# Patient Record
Sex: Female | Born: 1970 | Race: White | Hispanic: Yes | Smoking: Never smoker
Health system: Southern US, Community
[De-identification: ages and names within clinical notes are randomized; demographics above are authoritative.]

## PROBLEM LIST (undated history)

## (undated) DIAGNOSIS — I10 Essential (primary) hypertension: Secondary | ICD-10-CM

## (undated) HISTORY — DX: Essential (primary) hypertension: I10

---

## 1999-07-16 ENCOUNTER — Emergency Department (HOSPITAL_COMMUNITY): Admission: EM | Admit: 1999-07-16 | Discharge: 1999-07-16 | Payer: Self-pay | Admitting: Emergency Medicine

## 2000-08-23 ENCOUNTER — Other Ambulatory Visit: Admission: RE | Admit: 2000-08-23 | Discharge: 2000-08-23 | Payer: Self-pay | Admitting: Gynecology

## 2000-08-31 ENCOUNTER — Ambulatory Visit (HOSPITAL_COMMUNITY): Admission: RE | Admit: 2000-08-31 | Discharge: 2000-08-31 | Payer: Self-pay | Admitting: Gynecology

## 2000-08-31 ENCOUNTER — Encounter: Payer: Self-pay | Admitting: Gynecology

## 2001-10-20 ENCOUNTER — Other Ambulatory Visit: Admission: RE | Admit: 2001-10-20 | Discharge: 2001-10-20 | Payer: Self-pay | Admitting: Gynecology

## 2002-10-23 ENCOUNTER — Other Ambulatory Visit: Admission: RE | Admit: 2002-10-23 | Discharge: 2002-10-23 | Payer: Self-pay | Admitting: Gynecology

## 2005-11-10 ENCOUNTER — Ambulatory Visit (HOSPITAL_COMMUNITY): Admission: RE | Admit: 2005-11-10 | Discharge: 2005-11-10 | Payer: Self-pay | Admitting: Family Medicine

## 2005-11-18 ENCOUNTER — Ambulatory Visit: Payer: Self-pay | Admitting: Gynecology

## 2005-11-25 ENCOUNTER — Ambulatory Visit: Payer: Self-pay | Admitting: Gynecology

## 2005-12-02 ENCOUNTER — Ambulatory Visit: Payer: Self-pay | Admitting: Family Medicine

## 2005-12-09 ENCOUNTER — Ambulatory Visit: Payer: Self-pay | Admitting: Family Medicine

## 2005-12-10 ENCOUNTER — Ambulatory Visit (HOSPITAL_COMMUNITY): Admission: RE | Admit: 2005-12-10 | Discharge: 2005-12-10 | Payer: Self-pay | Admitting: Obstetrics & Gynecology

## 2005-12-20 ENCOUNTER — Ambulatory Visit: Payer: Self-pay | Admitting: Gynecology

## 2005-12-30 ENCOUNTER — Ambulatory Visit: Payer: Self-pay | Admitting: *Deleted

## 2006-01-06 ENCOUNTER — Ambulatory Visit: Payer: Self-pay | Admitting: Family Medicine

## 2006-01-10 ENCOUNTER — Inpatient Hospital Stay (HOSPITAL_COMMUNITY): Admission: AD | Admit: 2006-01-10 | Discharge: 2006-01-16 | Payer: Self-pay | Admitting: Obstetrics & Gynecology

## 2006-01-10 ENCOUNTER — Ambulatory Visit: Payer: Self-pay | Admitting: Gynecology

## 2006-01-10 ENCOUNTER — Ambulatory Visit (HOSPITAL_COMMUNITY): Admission: RE | Admit: 2006-01-10 | Discharge: 2006-01-10 | Payer: Self-pay | Admitting: Obstetrics & Gynecology

## 2008-06-11 IMAGING — US US OB FOLLOW-UP EACH ADDL GEST (MODIFY)
1 series · 12 of 28 positions shown · non-contrast
Comparison: 12/10/05.

CLINICAL DATA: Twin gestation with assigned gestational age of 34 weeks 6 days.  Evaluate fetal growth.  

TWIN OBSTETRICAL ULTRASOUND RE-EVALUATION:

[Series 1: us ob follow-up each addl gest (modify) · 12 of 41 slices shown]
[im 2/41]
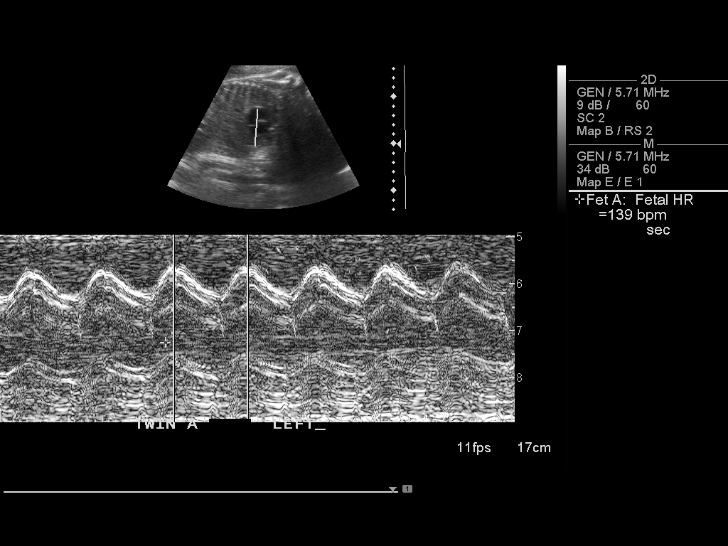
[im 5/41]
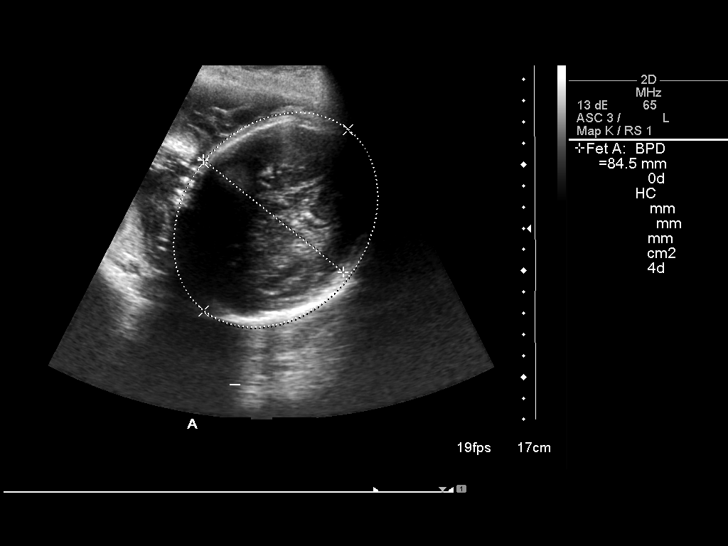
[im 8/41]
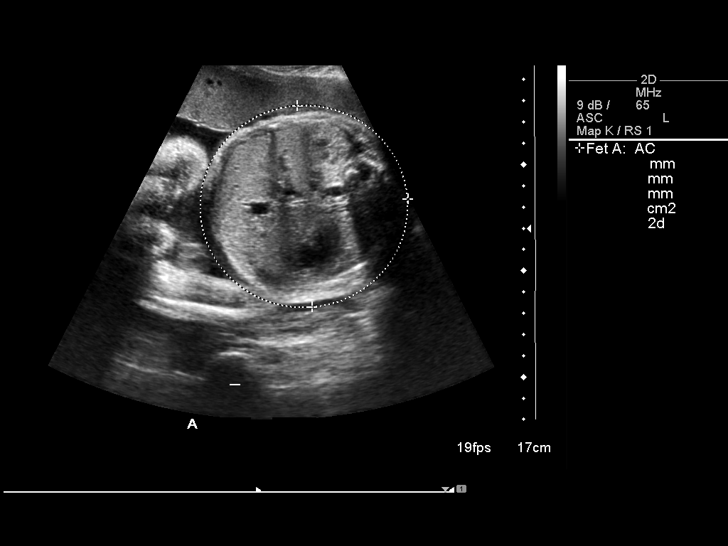
[im 12/41]
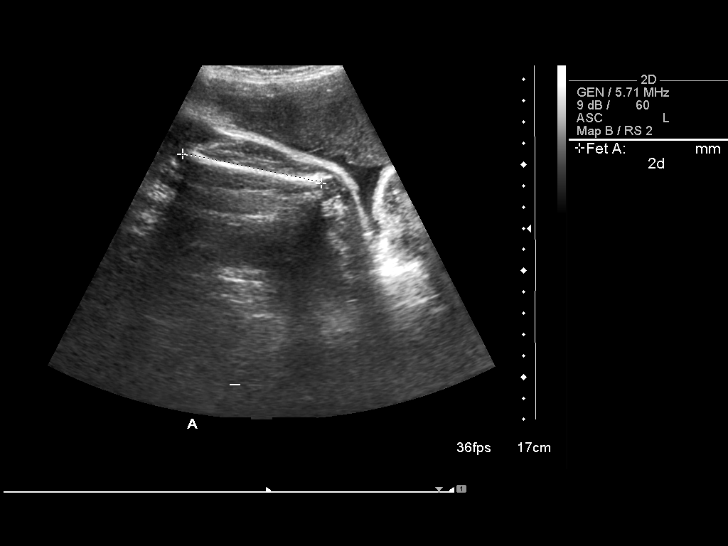
[im 15/41]
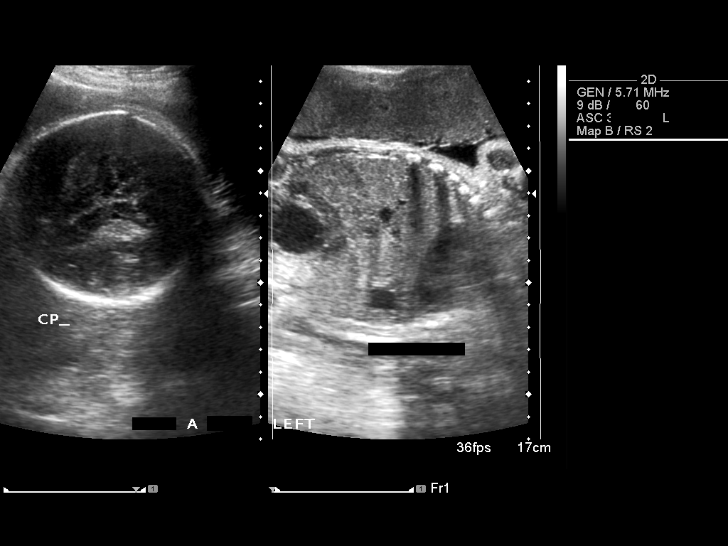
[im 18/41]
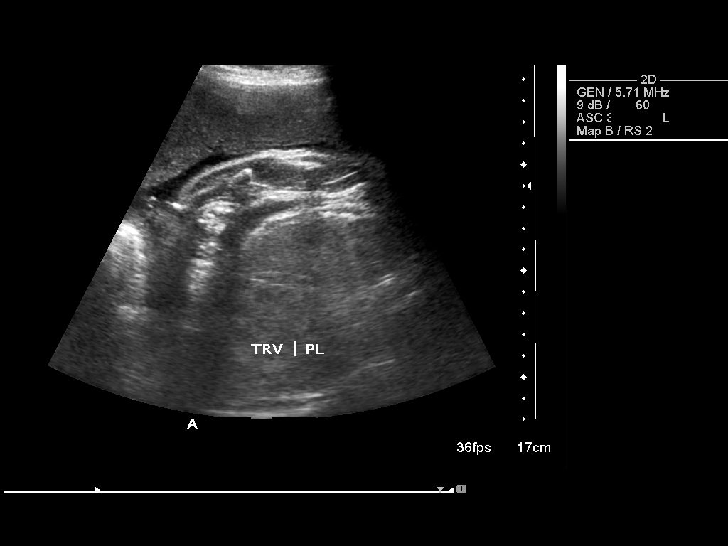
[im 23/41]
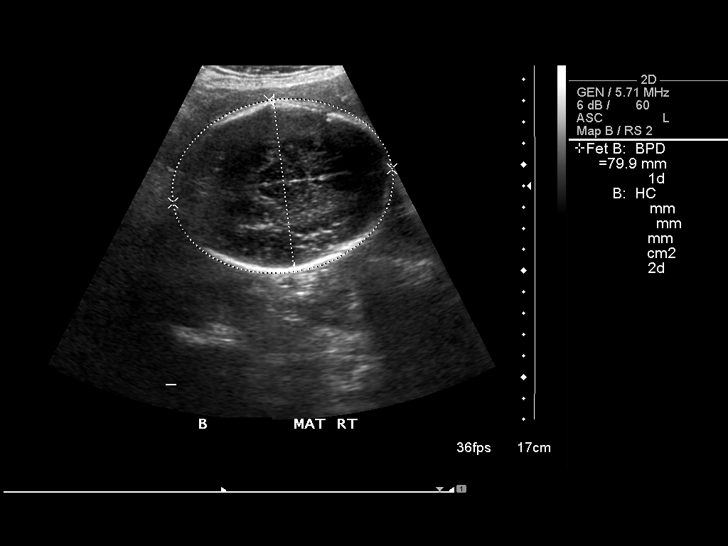
[im 26/41]
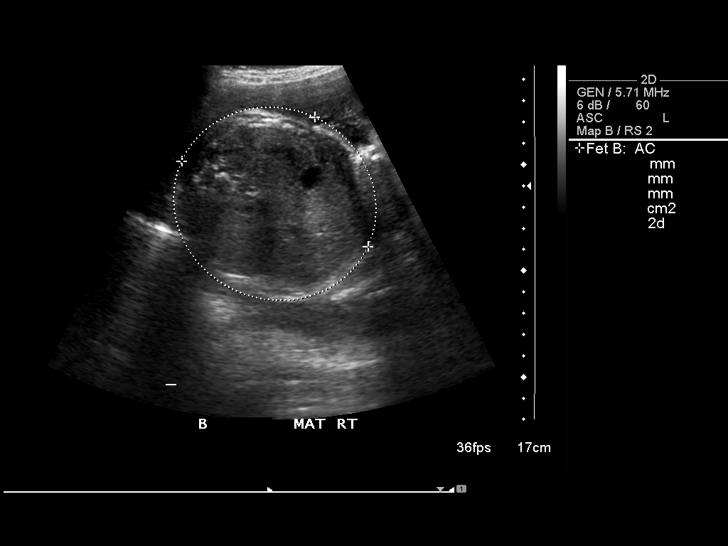
[im 29/41]
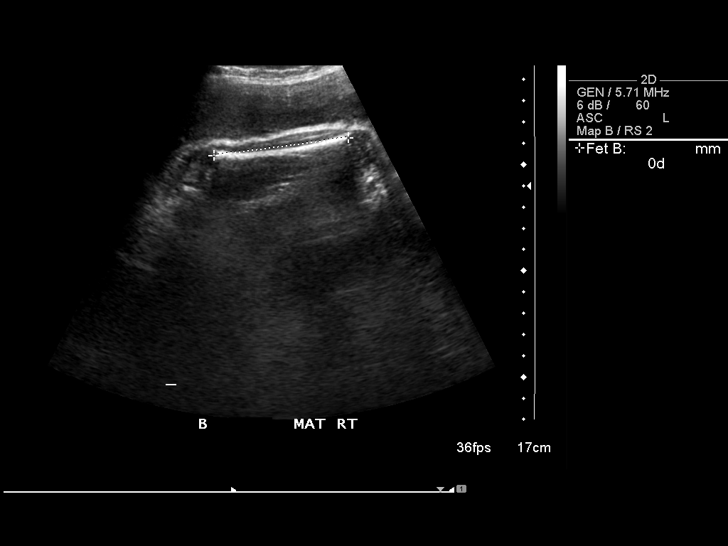
[im 33/41]
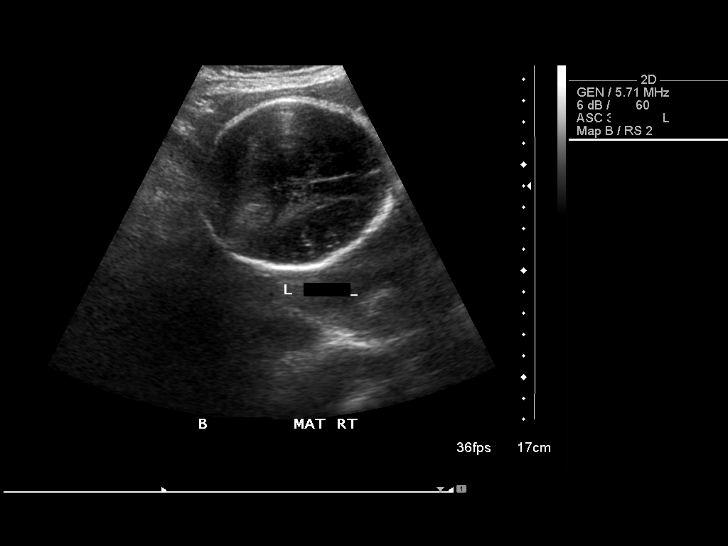
[im 36/41]
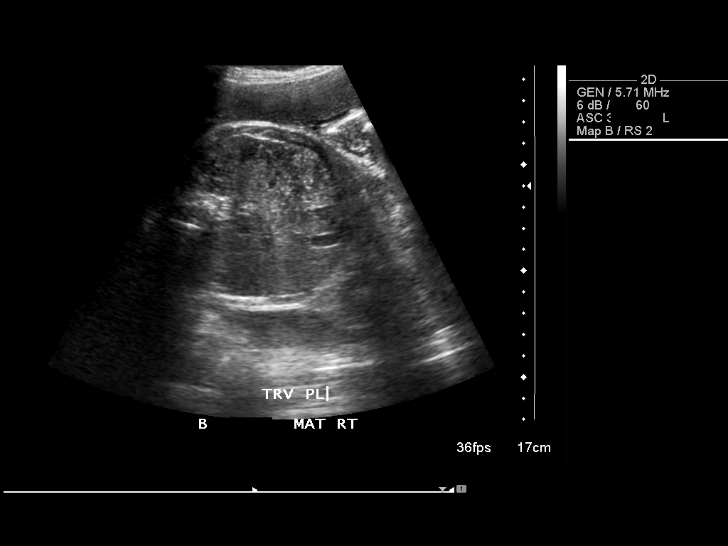
[im 39/41]
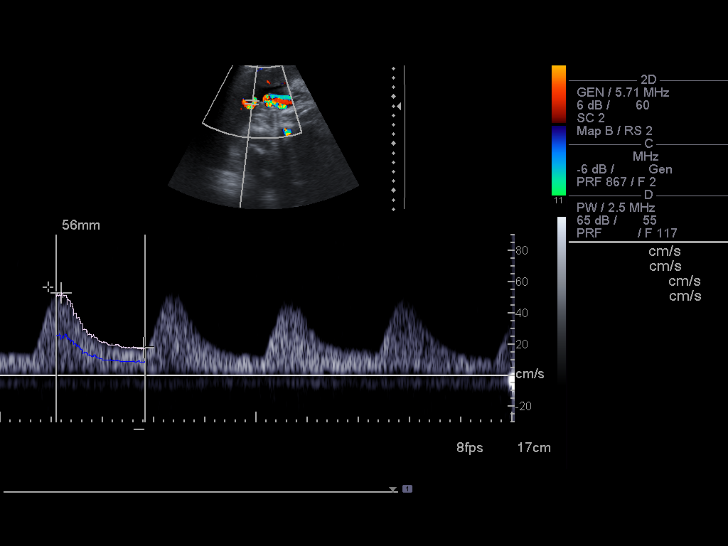

[12 of 28 positions shown; findings below may reference images not displayed]

A living intrauterine twin gestation is present.  A separating membrane is seen.

TWIN A:

Heart rate:  139 bpm
Movement:  Yes
Breathing:  Yes
Presentation:  Cephalic on the maternal left side
Placental Location:  Anterior
Placental Grade:  I
Placenta Previa:  No
Amniotic Fluid (Subjective):  Normal
Amniotic Fluid (Objective):  4.7 cm vertical pocket 

BPD:    8.4 cm  34 w 0 d 
HC:    30.8 cm   34 w 3 d 
AC:    29.8 cm  33 w 5 d 
FL:    6.6 cm   34 w 0 d
Mean GA:  34 w 0 d   US EDC:  02/21/06

EFW:  1522 grams 25th ? 50th %ile (7040 ? 9798 g) for 35 weeks 

FETAL ANATOMY (TWIN A):
Lateral Ventricle:    Previously visualized 
Thalami/CSP:   Previously visualized 
Posterior Fossa:    Previously visualized 
Nuchal Region:   Previously visualized 
Spine:    Previously visualized 
4-Chamber Heart:   Previously visualized 
Stomach:    Visualized 
3-Vessel Cord:    Previously visualized 
Abd Cord Insert:   Previously visualized 
Kidneys:    Visualized 
Bladder:    Visualized 
Extremities:    Previously visualized 

TWIN B:

Heart Rate:  141 bpm
Movement:  Yes
Breathing:  Yes
Presentation:  Cephalic on maternal right side
Placental Location:  Anterior
Placental Grade:  I
Placenta Previa:  No
Amniotic Fluid (Subjective):  Normal
Amniotic Fluid (Objective):  3.25 cm vertical pocket 

BPD:    8.0 cm   32 w 1 d 
HC:    29.9 cm   33 w 1 d 
AC:    29.5 cm   33 w 4 d 
FL:    6.4 cm   33 w 1 d 
Mean GA:  33 w 0 d   US EDC:  02/28/06

EFW:  4294 grams 25th ? 50th %ile (7040 ? 9798 g) for 35 weeks 

FETAL ANATOMY (TWIN B):
Lateral Ventricle:    Previously visualized 
Thalami/CSP:   Previously visualized 
Posterior Fossa:    Previously visualized 
Nuchal Region:   Previously visualized 
Spine:    Previously visualized 
4-Chamber Heart:   Previously visualized 
Stomach:    Visualized 
3-Vessel Cord:    Previously visualized 
Abd Cord Insert:   Previously visualized 
Kidneys:    Visualized 
Bladder:    Visualized 
Extremities:    Previously visualized 

MATERNAL UTERINE AND ADNEXAL FINDINGS
Cervix:  Not evaluated.
IMPRESSION: 1.  Living twin intrauterine gestation with assigned gestational age of 34 weeks 6 days.  Appropriate and concordant twin fetal growth.  
2.  Normal amniotic fluid volume for both fetuses.  

DOPPLER ULTRASOUND OF FETUS (TWIN B):

Umbilical Artery S/D Ratio:  3.29 (NL< 3.45)
IMPRESSION: Twin B umbilical artery S/D ratio is within normal limits for gestational age.

BPP SCORING (TWIN A)
Movement:  2  Time:  25 minutes
Breathing:  2
Tone:    2
Amniotic Fluid:  2

Total Score:  8

BPP SCORING (TWIN B)
Movements:  2  Time:  20 minutes
Breathing:  2
Tone:  2
Amniotic Fluid:  2

Total Score:    8
IMPRESSION: Biophysical profile scores of [DATE] for both fetuses.

## 2015-02-13 DIAGNOSIS — Z0130 Encounter for examination of blood pressure without abnormal findings: Secondary | ICD-10-CM

## 2015-02-13 NOTE — Congregational Nurse Program (Signed)
Congregational Nurse Program Note  Date of Encounter: 02/13/2015  Past Medical History: No past medical history on file.  Encounter Details:     CNP Questionnaire - 02/13/15 1616    Patient Demographics   Is this a new or existing patient? New   Patient is considered a/an Immigrant   Race Latino/Hispanic   Patient Assistance   Location of Patient Assistance [facility]   Patient's financial/insurance status Private Insurance Coverage   Uninsured Patient No   Patient referred to apply for the following financial assistance Not Applicable   Food insecurities addressed Not Applicable   Transportation assistance No   Assistance securing medications No   Educational health offerings Health literacy   Encounter Details   Primary purpose of visit Spiritual Care/Support Visit   Was an Emergency Department visit averted? Not Applicable   Does patient have a medical provider? Yes   Patient referred to Not Applicable   Was a mental health screening completed? (GAINS tool) No   Does patient have dental issues? No   Since previous encounter, have you referred patient for abnormal blood pressure that resulted in a new diagnosis or medication change? No   Since previous encounter, have you referred patient for abnormal blood glucose that resulted in a new diagnosis or medication change? No       Vs. 104/61 pulse 68  Also discussed with pt. Going to the [employer] to change her address and have her mail sent to her new address.  Her children's worker at [employer] is Ms. Lonna Cobb at [phone]. RN called and lvm for Ms. Romeroand gave her the new address for this pt's children.

## 2015-04-03 DIAGNOSIS — Z139 Encounter for screening, unspecified: Secondary | ICD-10-CM

## 2015-04-03 NOTE — Congregational Nurse Program (Signed)
Congregational Nurse Program Note  Date of Encounter: 04/03/2015  Past Medical History: No past medical history on file.  Encounter Details:     CNP Questionnaire - 04/03/15 1617    Patient Demographics   Is this a new or existing patient? New   Patient is considered a/an Immigrant   Race Latino/Hispanic   Patient Assistance   Location of Patient Assistance [place]   Patient's financial/insurance status Private Insurance Coverage   Uninsured Patient No   Patient referred to apply for the following financial assistance Not Applicable   Food insecurities addressed Not Applicable   Transportation assistance No   Assistance securing medications No   Educational health offerings Other   Encounter Details   Primary purpose of visit Education/Health Concerns   Was an Emergency Department visit averted? Not Applicable   Does patient have a medical provider? Yes   Patient referred to Not Applicable   Was a mental health screening completed? (GAINS tool) No   Does patient have dental issues? No   Does patient have vision issues? No   Since previous encounter, have you referred patient for abnormal blood pressure that resulted in a new diagnosis or medication change? No   Since previous encounter, have you referred patient for abnormal blood glucose that resulted in a new diagnosis or medication change? No     Patient attended seminar presented by [email or web address] student on alcohol abuse at [facility]. VSS BP 128/77 mmHg  Pulse 71 No health concerns at this time. Fransisco Beauebbie Shereese Bonnie, RN, [place]NP [phone]

## 2018-10-24 ENCOUNTER — Other Ambulatory Visit: Payer: Self-pay

## 2018-10-24 ENCOUNTER — Encounter (HOSPITAL_COMMUNITY): Payer: Self-pay | Admitting: Emergency Medicine

## 2018-10-24 ENCOUNTER — Emergency Department (HOSPITAL_COMMUNITY)
Admission: EM | Admit: 2018-10-24 | Discharge: 2018-10-25 | Disposition: A | Discharge status: Home / Self Care | Payer: BLUE CROSS/BLUE SHIELD | Attending: Emergency Medicine | Admitting: Emergency Medicine

## 2018-10-24 DIAGNOSIS — R5383 Other fatigue: Secondary | ICD-10-CM | POA: Diagnosis not present

## 2018-10-24 DIAGNOSIS — R232 Flushing: Secondary | ICD-10-CM | POA: Insufficient documentation

## 2018-10-24 DIAGNOSIS — R1013 Epigastric pain: Secondary | ICD-10-CM | POA: Insufficient documentation

## 2018-10-24 DIAGNOSIS — Z79899 Other long term (current) drug therapy: Secondary | ICD-10-CM | POA: Diagnosis not present

## 2018-10-24 DIAGNOSIS — R531 Weakness: Secondary | ICD-10-CM | POA: Diagnosis not present

## 2018-10-24 DIAGNOSIS — R42 Dizziness and giddiness: Secondary | ICD-10-CM | POA: Diagnosis not present

## 2018-10-24 DIAGNOSIS — R1084 Generalized abdominal pain: Secondary | ICD-10-CM

## 2018-10-24 DIAGNOSIS — R110 Nausea: Secondary | ICD-10-CM | POA: Diagnosis not present

## 2018-10-24 LAB — URINALYSIS, ROUTINE W REFLEX MICROSCOPIC
Bacteria, UA: NONE SEEN
Bilirubin Urine: NEGATIVE
Glucose, UA: NEGATIVE mg/dL
Ketones, ur: NEGATIVE mg/dL
Leukocytes,Ua: NEGATIVE
Nitrite: NEGATIVE
Protein, ur: NEGATIVE mg/dL
Specific Gravity, Urine: 1.001 — ABNORMAL LOW (ref 1.005–1.030)
pH: 6 (ref 5.0–8.0)

## 2018-10-24 LAB — CBC
HCT: 38.4 % (ref 36.0–46.0)
Hemoglobin: 12.5 g/dL (ref 12.0–15.0)
MCH: 30.9 pg (ref 26.0–34.0)
MCHC: 32.6 g/dL (ref 30.0–36.0)
MCV: 95 fL (ref 80.0–100.0)
Platelets: 300 10*3/uL (ref 150–400)
RBC: 4.04 MIL/uL (ref 3.87–5.11)
RDW: 13.2 % (ref 11.5–15.5)
WBC: 6.7 10*3/uL (ref 4.0–10.5)
nRBC: 0 % (ref 0.0–0.2)

## 2018-10-24 LAB — COMPREHENSIVE METABOLIC PANEL
ALT: 21 U/L (ref 0–44)
AST: 22 U/L (ref 15–41)
Albumin: 4.7 g/dL (ref 3.5–5.0)
Alkaline Phosphatase: 95 U/L (ref 38–126)
Anion gap: 10 (ref 5–15)
BUN: 8 mg/dL (ref 6–20)
CO2: 22 mmol/L (ref 22–32)
Calcium: 9 mg/dL (ref 8.9–10.3)
Chloride: 108 mmol/L (ref 98–111)
Creatinine, Ser: 0.52 mg/dL (ref 0.44–1.00)
GFR calc Af Amer: >60 mL/min (ref >60)
GFR calc non Af Amer: >60 mL/min (ref >60)
Glucose, Bld: 111 mg/dL — ABNORMAL HIGH (ref 70–99)
Potassium: 3.5 mmol/L (ref 3.5–5.1)
Sodium: 140 mmol/L (ref 135–145)
Total Bilirubin: 0.5 mg/dL (ref 0.3–1.2)
Total Protein: 7.3 g/dL (ref 6.5–8.1)

## 2018-10-24 LAB — I-STAT BETA HCG BLOOD, ED (MC, WL, AP ONLY): I-stat hCG, quantitative: <5 m[IU]/mL (ref <5)

## 2018-10-24 LAB — LIPASE, BLOOD: Lipase: 24 U/L (ref 11–51)

## 2018-10-24 NOTE — ED Triage Notes (Signed)
Spanish interpretor #[id number] Bland Span.  Feeling dizzy, nauseated, and abd pains, chills, since Saturday. Her nerves are making her feel SOB. Went to clinic yesterday for UTI where got two injections and started antibiotics today.

## 2018-10-25 ENCOUNTER — Emergency Department (HOSPITAL_COMMUNITY): Payer: BLUE CROSS/BLUE SHIELD

## 2018-10-25 ENCOUNTER — Encounter (HOSPITAL_COMMUNITY): Payer: Self-pay

## 2018-10-25 MED ORDER — MORPHINE SULFATE (PF) 4 MG/ML IV SOLN
4.0000 mg | Freq: Once | INTRAVENOUS | Status: AC
  Administered 2018-10-25: 03:00:00 4 mg via INTRAVENOUS
  Filled 2018-10-25: qty 1

## 2018-10-25 MED ORDER — DICYCLOMINE HCL 20 MG PO TABS: 20.0000 mg | ORAL_TABLET | Freq: Three times a day (TID) | ORAL | 0 refills | Status: AC

## 2018-10-25 MED ORDER — SODIUM CHLORIDE (PF) 0.9 % IJ SOLN
INTRAMUSCULAR | Status: AC
  Filled 2018-10-25: qty 50

## 2018-10-25 MED ORDER — IOHEXOL 300 MG/ML  SOLN
100.0000 mL | Freq: Once | INTRAMUSCULAR | Status: AC | PRN
  Administered 2018-10-25: 03:00:00 100 mL via INTRAVENOUS

## 2018-10-25 MED ORDER — ONDANSETRON HCL 4 MG PO TABS: 4.0000 mg | ORAL_TABLET | Freq: Four times a day (QID) | ORAL | 0 refills | Status: AC | PRN

## 2018-10-25 MED ORDER — FAMOTIDINE 20 MG PO TABS: 20.0000 mg | ORAL_TABLET | Freq: Two times a day (BID) | ORAL | 0 refills | Status: AC

## 2018-10-25 MED ORDER — ONDANSETRON HCL 4 MG/2ML IJ SOLN
4.0000 mg | Freq: Once | INTRAMUSCULAR | Status: AC
  Administered 2018-10-25: 4 mg via INTRAVENOUS
  Filled 2018-10-25: qty 2

## 2018-10-25 NOTE — ED Provider Notes (Addendum)
[facility]-EMERGENCY DEPT Provider Note   CSN: [id number] Arrival date & time: 10/24/18  1733     History   Chief Complaint Chief Complaint  Patient presents with   Abdominal Pain   Urinary Tract Infection   Dizziness    HPI Cassandra Lozano is a 48 y.o. female.     Patient presents to the emergency department for evaluation of abdominal pain.  Patient reports that symptoms began 4 or 5 days ago.  She started to have upper abdominal discomfort after drinking coffee which she does not normally do.  She has had waxing and waning pain in the center of her abdomen as well as left lower portion of her abdomen since then.  She has had nausea without vomiting.  She has felt like she needs to have a bowel movement but cannot.  She has intermittently been feeling weak, dizzy and at times feels hot and flushed.  She has had associated dizziness.  Patient reports that she was seen at the clinic yesterday.  She had a pelvic exam during that visit and ultimately was diagnosed with a urinary tract infection, started on antibiotic.  Patient reports that her symptoms persist despite starting the antibiotic.  The history is limited by a language barrier. A language interpreter was used.    History reviewed. No pertinent past medical history.  There are no active problems to display for this patient.   History reviewed. No pertinent surgical history.   OB History   No obstetric history on file.      Home Medications    Prior to Admission medications   Medication Sig Start Date End Date Taking? Authorizing Provider  fluconazole (DIFLUCAN) 150 MG tablet Take 150 mg by mouth once. Repeat dose in 8 days. 10/23/18  Yes [provider]  metroNIDAZOLE (FLAGYL) 500 MG tablet Take 500 mg by mouth 2 (two) times daily. 10/23/18  Yes [provider]  dicyclomine (BENTYL) 20 MG tablet Take 1 tablet (20 mg total) by mouth 3 (three) times daily before meals.  10/25/18   Gilda CreasePollina, Kenyatte Gruber J, MD  famotidine (PEPCID) 20 MG tablet Take 1 tablet (20 mg total) by mouth 2 (two) times daily. 10/25/18   Gilda CreasePollina, Mayank Teuscher J, MD  ondansetron (ZOFRAN) 4 MG tablet Take 1 tablet (4 mg total) by mouth every 6 (six) hours as needed for nausea or vomiting. 10/25/18   Treanna Dumler, Canary Brimhristopher J, MD    Family History No family history on file.  Social History Social History   Tobacco Use   Smoking status: Not on file  Substance Use Topics   Alcohol use: Not on file   Drug use: Not on file     Allergies   Patient has no known allergies.   Review of Systems Review of Systems  Constitutional: Positive for fatigue.  Gastrointestinal: Positive for abdominal pain and nausea.  Neurological: Positive for dizziness.  All other systems reviewed and are negative.    Physical Exam Updated Vital Signs BP 112/68    Pulse (!) 57    Temp 99.5 F (37.5 C) (Oral)    Resp 14    LMP 10/07/2018 Comment: negative beta HCG 10/24/18   SpO2 99%   Physical Exam Vitals signs and nursing note reviewed.  Constitutional:      General: She is not in acute distress.    Appearance: Normal appearance. She is well-developed.  HENT:     Head: Normocephalic and atraumatic.     Right Ear: Hearing  normal.     Left Ear: Hearing normal.     Nose: Nose normal.  Eyes:     Conjunctiva/sclera: Conjunctivae normal.     Pupils: Pupils are equal, round, and reactive to light.  Neck:     Musculoskeletal: Normal range of motion and neck supple.  Cardiovascular:     Rate and Rhythm: Regular rhythm.     Heart sounds: S1 normal and S2 normal. No murmur. No friction rub. No gallop.   Pulmonary:     Effort: Pulmonary effort is normal. No respiratory distress.     Breath sounds: Normal breath sounds.  Chest:     Chest wall: No tenderness.  Abdominal:     General: Bowel sounds are normal.     Palpations: Abdomen is soft.     Tenderness: There is abdominal tenderness in the  epigastric area and left lower quadrant. There is no guarding or rebound. Negative signs include Murphy's sign and McBurney's sign.     Hernia: No hernia is present.  Musculoskeletal: Normal range of motion.  Skin:    General: Skin is warm and dry.     Findings: No rash.  Neurological:     Mental Status: She is alert and oriented to person, place, and time.     GCS: GCS eye subscore is 4. GCS verbal subscore is 5. GCS motor subscore is 6.     Cranial Nerves: No cranial nerve deficit.     Sensory: No sensory deficit.     Coordination: Coordination normal.  Psychiatric:        Speech: Speech normal.        Behavior: Behavior normal.        Thought Content: Thought content normal.      ED Treatments / Results  Labs (all labs ordered are listed, but only abnormal results are displayed) Labs Reviewed  COMPREHENSIVE METABOLIC PANEL - Abnormal; Notable for the following components:      Result Value   Glucose, Bld 111 (*)    All other components within normal limits  URINALYSIS, ROUTINE W REFLEX MICROSCOPIC - Abnormal; Notable for the following components:   Color, Urine COLORLESS (*)    Specific Gravity, Urine 1.001 (*)    Hgb urine dipstick SMALL (*)    All other components within normal limits  LIPASE, BLOOD  CBC  I-STAT BETA HCG BLOOD, ED ([facility], [facility], [facility] ONLY)    EKG None  Radiology Ct Abdomen Pelvis W Contrast  Result Date: 10/25/2018 CLINICAL DATA:  Abdominal pain. Nausea. Diverticulitis suspected. EXAM: CT ABDOMEN AND PELVIS WITH CONTRAST TECHNIQUE: Multidetector CT imaging of the abdomen and pelvis was performed using the standard protocol following bolus administration of intravenous contrast. CONTRAST:  127mL OMNIPAQUE IOHEXOL 300 MG/ML  SOLN COMPARISON:  None. FINDINGS: Lower chest: The lung bases are clear. Hepatobiliary: No focal liver abnormality is seen. No gallstones, gallbladder wall thickening, or biliary dilatation. Pancreas: No ductal dilatation or inflammation.  Spleen: Normal in size without focal abnormality. Adrenals/Urinary Tract: Normal adrenal glands. No hydronephrosis or perinephric edema. Homogeneous renal enhancement with symmetric excretion on delayed phase imaging. Urinary bladder is nondistended and not well evaluated. Stomach/Bowel: Stomach is within normal limits. Appendix appears normal. No evidence of bowel wall thickening, distention, or inflammatory changes. Small to moderate colonic stool burden. There is sigmoid colonic redundancy. Vascular/Lymphatic: Prominent periuterine and adnexal vascularity with dilatation of the left ovarian vein measuring 6 mm. Normal caliber abdominal aorta. The portal vein is patent. No enlarged lymph nodes  in the abdomen or pelvis. Reproductive: Prominent adnexal and periuterine vascularity and dilatation of the ovarian veins, left greater than right. The uterus is retroverted. Soft tissue prominence of the cervix, series 2, image 71. No adnexal mass. Other: No free air, free fluid, or intra-abdominal fluid collection. Musculoskeletal: There are no acute or suspicious osseous abnormalities. Degenerative disc disease at L5-S1. Degenerative change about both sacroiliac joints. IMPRESSION: 1. No acute abnormality in the abdomen/pelvis. 2. Prominent periuterine and adnexal vascularity and dilatation of the left ovarian vein, can be seen with pelvic congestion syndrome in the appropriate clinical setting. 3. Soft tissue prominence of the cervix, recommend correlation with physical exam. Electronically Signed   By: Narda Rutherford M.D.   On: 10/25/2018 03:41    Procedures Procedures (including critical care time)  Medications Ordered in ED Medications  sodium chloride (PF) 0.9 % injection (has no administration in time range)  morphine 4 MG/ML injection 4 mg (4 mg Intravenous Given 10/25/18 0240)  ondansetron (ZOFRAN) injection 4 mg (4 mg Intravenous Given 10/25/18 0239)  iohexol (OMNIPAQUE) 300 MG/ML solution 100 mL (100  mLs Intravenous Contrast Given 10/25/18 0317)     Initial Impression / Assessment and Plan / ED Course  I have reviewed the triage vital signs and the nursing notes.  Pertinent labs & imaging results that were available during my care of the patient were reviewed by me and considered in my medical decision making (see chart for details).        Patient presents to the emergency department for evaluation of abdominal pain.  Symptoms ongoing for several days.  She was seen at a clinic yesterday and had urinary and pelvic testing.  She was started on antibiotics for UTI.  Urinalysis today does not look particularly infected but I cannot tell if it is simply partially treated.  She indicates epigastric area as well as left-sided lower abdominal pain.  Symptoms began after drinking coffee.  This seems indicated GI cause.  Lab work was unremarkable.  CT scan was performed and no acute pathology is seen other than possible pelvic congestion syndrome and prominent cervix.  She reports having pelvic exam performed yesterday, will refer to OB/GYN for formal evaluation.  Final Clinical Impressions(s) / ED Diagnoses   Final diagnoses:  Generalized abdominal pain    ED Discharge Orders         Ordered    famotidine (PEPCID) 20 MG tablet  2 times daily     10/25/18 0356    dicyclomine (BENTYL) 20 MG tablet  3 times daily before meals     10/25/18 0356    ondansetron (ZOFRAN) 4 MG tablet  Every 6 hours PRN     10/25/18 0356           Gilda Crease, MD 10/25/18 [id number]    Gilda Crease, MD 11/07/18 [phone]

## 2018-10-25 NOTE — ED Notes (Signed)
Translator [phone] used to interpret for pt. Pt comfortable at this time and updated on what she is currently waiting for. Pt denied needing anything at this time and has call bell where she can reach it. Pt knows to call staff if assistance is needed.

## 2018-11-08 ENCOUNTER — Encounter: Payer: Self-pay | Admitting: Obstetrics

## 2018-11-08 ENCOUNTER — Ambulatory Visit: Payer: BLUE CROSS/BLUE SHIELD | Admitting: Obstetrics

## 2018-11-08 ENCOUNTER — Other Ambulatory Visit: Payer: BLUE CROSS/BLUE SHIELD

## 2018-11-08 ENCOUNTER — Other Ambulatory Visit: Payer: Self-pay

## 2018-11-08 VITALS — BP 106/72 | HR 71 | Ht 61.0 in | Wt 121.0 lb

## 2018-11-08 DIAGNOSIS — Z3202 Encounter for pregnancy test, result negative: Secondary | ICD-10-CM

## 2018-11-08 DIAGNOSIS — N898 Other specified noninflammatory disorders of vagina: Secondary | ICD-10-CM

## 2018-11-08 DIAGNOSIS — K5901 Slow transit constipation: Secondary | ICD-10-CM

## 2018-11-08 DIAGNOSIS — Z1151 Encounter for screening for human papillomavirus (HPV): Secondary | ICD-10-CM

## 2018-11-08 DIAGNOSIS — R102 Pelvic and perineal pain: Secondary | ICD-10-CM

## 2018-11-08 DIAGNOSIS — Z01419 Encounter for gynecological examination (general) (routine) without abnormal findings: Secondary | ICD-10-CM

## 2018-11-08 DIAGNOSIS — Z1239 Encounter for other screening for malignant neoplasm of breast: Secondary | ICD-10-CM

## 2018-11-08 DIAGNOSIS — Z124 Encounter for screening for malignant neoplasm of cervix: Secondary | ICD-10-CM | POA: Diagnosis not present

## 2018-11-08 DIAGNOSIS — Z3009 Encounter for other general counseling and advice on contraception: Secondary | ICD-10-CM

## 2018-11-08 LAB — POCT URINE PREGNANCY: Preg Test, Ur: NEGATIVE

## 2018-11-08 MED ORDER — DOCUSATE SODIUM 100 MG PO CAPS: 100.0000 mg | ORAL_CAPSULE | Freq: Two times a day (BID) | ORAL | 2 refills | Status: AC

## 2018-11-08 MED ORDER — IBUPROFEN 800 MG PO TABS: 800.0000 mg | ORAL_TABLET | Freq: Three times a day (TID) | ORAL | 5 refills | Status: AC | PRN

## 2018-11-08 NOTE — Progress Notes (Signed)
Subjective:        Cassandra Lozano is a 48 y.o. female here for a routine exam.  Current complaints: Lower abdominal and pelvic pain ( LLQ ) over the past month.  Denies vaginal discharge or dysuria.  Contraception: None  Personal health questionnaire:  Is patient Ashkenazi Jewish, have a family history of breast and/or ovarian cancer: no Is there a family history of uterine cancer diagnosed at age < 76, gastrointestinal cancer, urinary tract cancer, family member who is a Personnel officer syndrome-associated carrier: no Is the patient overweight and hypertensive, family history of diabetes, personal history of gestational diabetes, preeclampsia or PCOS: no Is patient over 78, have PCOS,  family history of premature CHD under age 46, diabetes, smoke, have hypertension or peripheral artery disease:  no At any time, has a partner hit, kicked or otherwise hurt or frightened you?: no Over the past 2 weeks, have you felt down, depressed or hopeless?: no Over the past 2 weeks, have you felt little interest or pleasure in doing things?:no   Gynecologic History Patient's last menstrual period was 10/07/2018 (within weeks). Contraception: none Last Pap: unknown. Results were: unknown Last mammogram: unknown. Results were: unknown  Obstetric History OB History  Gravida Para Term Preterm AB Living  1 1 1     2   SAB TAB Ectopic Multiple Live Births        1 2    # Outcome Date GA Lbr Len/2nd Weight Sex Delivery Anes PTL Lv  1A Term 01/13/06     Vag-Spont   LIV  1B Term 01/13/06     Vag-Spont   LIV    History reviewed. No pertinent past medical history.  History reviewed. No pertinent surgical history.   Current Outpatient Medications:  .  dicyclomine (BENTYL) 20 MG tablet, Take 1 tablet (20 mg total) by mouth 3 (three) times daily before meals. (Patient not taking: Reported on 11/08/2018), Disp: 30 tablet, Rfl: 0 .  docusate sodium (COLACE) 100 MG capsule, Take 1 capsule (100 mg total) by  mouth 2 (two) times daily., Disp: 60 capsule, Rfl: 2 .  famotidine (PEPCID) 20 MG tablet, Take 1 tablet (20 mg total) by mouth 2 (two) times daily. (Patient not taking: Reported on 11/08/2018), Disp: 30 tablet, Rfl: 0 .  fluconazole (DIFLUCAN) 150 MG tablet, Take 150 mg by mouth once. Repeat dose in 8 days., Disp: , Rfl:  .  ibuprofen (ADVIL) 800 MG tablet, Take 1 tablet (800 mg total) by mouth every 8 (eight) hours as needed., Disp: 30 tablet, Rfl: 5 .  metroNIDAZOLE (FLAGYL) 500 MG tablet, Take 500 mg by mouth 2 (two) times daily., Disp: , Rfl:  .  ondansetron (ZOFRAN) 4 MG tablet, Take 1 tablet (4 mg total) by mouth every 6 (six) hours as needed for nausea or vomiting. (Patient not taking: Reported on 11/08/2018), Disp: 20 tablet, Rfl: 0 No Known Allergies  Social History   Tobacco Use  . Smoking status: Never Smoker  . Smokeless tobacco: Never Used  Substance Use Topics  . Alcohol use: Never    Frequency: Never    History reviewed. No pertinent family history.    Review of Systems  Constitutional: negative for fatigue and weight loss Respiratory: negative for cough and wheezing Cardiovascular: negative for chest pain, fatigue and palpitations Gastrointestinal: negative for abdominal pain and change in bowel habits Musculoskeletal:negative for myalgias Neurological: negative for gait problems and tremors Behavioral/Psych: negative for abusive relationship, depression Endocrine: negative for temperature intolerance  Genitourinary:negative for abnormal menstrual periods, genital lesions, hot flashes, sexual problems and vaginal discharge Integument/breast: negative for breast lump, breast tenderness, nipple discharge and skin lesion(s)    Objective:       BP 106/72   Pulse 71   Ht 5\' 1"  (1.549 m)   Wt 121 lb (54.9 kg)   LMP 10/07/2018 (Within Weeks)   BMI 22.86 kg/m  General:   alert  Skin:   no rash or abnormalities  Lungs:   clear to auscultation bilaterally  Heart:    regular rate and rhythm, S1, S2 normal, no murmur, click, rub or gallop  Breasts:   normal without suspicious masses, skin or nipple changes or axillary nodes  Abdomen:  normal findings: no organomegaly, soft, non-tender and no hernia  Pelvis:  External genitalia: normal general appearance Urinary system: urethral meatus normal and bladder without fullness, nontender Vaginal: normal without tenderness, induration or masses Cervix: normal appearance Adnexa: normal bimanual exam Uterus: anteverted and non-tender, normal size   Lab Review Urine pregnancy test Labs reviewed yes Radiologic studies reviewed yes  50% of 25 min visit spent on counseling and coordination of care.   Assessment:     1. Encounter for gynecological examination with Papanicolaou smear of cervix Rx: - Cytology - PAP( [facility]) - POCT urine pregnancy  2. Pelvic pain - probable GI etiology  3. Vaginal discharge Rx: - Cervicovaginal ancillary only( [facility])  4. Encounter for other general counseling and advice on contraception - wants ParaGuard IUD.  She will call to schedule insertion  5. Constipation by delayed colonic transit Rx: - docusate sodium (COLACE) 100 MG capsule; Take 1 capsule (100 mg total) by mouth 2 (two) times daily.  Dispense: 60 capsule; Refill: 2 - ibuprofen (ADVIL) 800 MG tablet; Take 1 tablet (800 mg total) by mouth every 8 (eight) hours as needed.  Dispense: 30 tablet; Refill: 5  6. Screening breast examination Rx: - MM Digital Screening; Future    Plan:    Education reviewed: calcium supplements, depression evaluation, low fat, low cholesterol diet, safe sex/STD prevention, self breast exams and weight bearing exercise. Contraception: none. Mammogram ordered. Follow up in: a few months. Wants ParaGuard IUD in future  Meds ordered this encounter  Medications  . docusate sodium (COLACE) 100 MG capsule    Sig: Take 1 capsule (100 mg total) by mouth 2 (two) times  daily.    Dispense:  60 capsule    Refill:  2  . ibuprofen (ADVIL) 800 MG tablet    Sig: Take 1 tablet (800 mg total) by mouth every 8 (eight) hours as needed.    Dispense:  30 tablet    Refill:  5   Orders Placed This Encounter  Procedures  . MM Digital Screening    Standing Status:   Future    Standing Expiration Date:   01/08/2020    Order Specific Question:   Reason for Exam (SYMPTOM  OR DIAGNOSIS REQUIRED)    Answer:   Screening    Order Specific Question:   Is the patient pregnant?    Answer:   No    Order Specific Question:   Preferred imaging location?    Answer:   [facility]  . POCT urine pregnancy    Shelly Bombard, MD 11/08/2018 11:52 AM

## 2018-11-08 NOTE — Progress Notes (Signed)
New Patient is in the office, complains of abdominal and pelvic pain that started last month. Pt had CT scan on 10-25-18, has regular cycles, LMP 10-07-18.

## 2018-11-13 LAB — CERVICOVAGINAL ANCILLARY ONLY
Bacterial Vaginitis (gardnerella): POSITIVE — AB
Candida Glabrata: NEGATIVE
Candida Vaginitis: NEGATIVE
Chlamydia: NEGATIVE
Comment: NEGATIVE
Comment: NEGATIVE
Comment: NEGATIVE
Comment: NEGATIVE
Comment: NEGATIVE
Comment: NORMAL
Neisseria Gonorrhea: NEGATIVE
Trichomonas: NEGATIVE

## 2018-11-14 LAB — CYTOLOGY - PAP
Adequacy: ABSENT
Comment: NEGATIVE
Diagnosis: NEGATIVE
High risk HPV: NEGATIVE

## 2018-11-15 ENCOUNTER — Other Ambulatory Visit: Payer: Self-pay | Admitting: Obstetrics

## 2021-05-19 ENCOUNTER — Other Ambulatory Visit: Payer: Self-pay | Admitting: Family Medicine

## 2021-05-19 DIAGNOSIS — N951 Menopausal and female climacteric states: Secondary | ICD-10-CM

## 2021-05-22 ENCOUNTER — Ambulatory Visit (INDEPENDENT_AMBULATORY_CARE_PROVIDER_SITE_OTHER): Payer: 59

## 2021-05-22 DIAGNOSIS — G8929 Other chronic pain: Secondary | ICD-10-CM | POA: Diagnosis not present

## 2021-05-22 DIAGNOSIS — N951 Menopausal and female climacteric states: Secondary | ICD-10-CM | POA: Diagnosis not present

## 2021-05-22 DIAGNOSIS — R102 Pelvic and perineal pain: Secondary | ICD-10-CM | POA: Diagnosis not present

## 2021-07-30 ENCOUNTER — Other Ambulatory Visit: Payer: Self-pay | Admitting: Family Medicine

## 2021-07-30 DIAGNOSIS — N390 Urinary tract infection, site not specified: Secondary | ICD-10-CM

## 2021-08-10 ENCOUNTER — Ambulatory Visit (INDEPENDENT_AMBULATORY_CARE_PROVIDER_SITE_OTHER): Payer: 59

## 2021-08-10 DIAGNOSIS — N390 Urinary tract infection, site not specified: Secondary | ICD-10-CM | POA: Diagnosis not present

## 2022-07-24 HISTORY — PX: OTHER SURGICAL HISTORY: SHX169

## 2022-07-26 ENCOUNTER — Other Ambulatory Visit (HOSPITAL_COMMUNITY)
Admission: RE | Admit: 2022-07-26 | Discharge: 2022-07-26 | Disposition: A | Discharge status: Home / Self Care | Payer: 59 | Source: Ambulatory Visit | Attending: Nurse Practitioner | Admitting: Nurse Practitioner

## 2022-07-26 DIAGNOSIS — Z7251 High risk heterosexual behavior: Secondary | ICD-10-CM | POA: Diagnosis not present

## 2022-07-26 DIAGNOSIS — Z124 Encounter for screening for malignant neoplasm of cervix: Secondary | ICD-10-CM | POA: Insufficient documentation

## 2022-07-27 LAB — CYTOLOGY - PAP
Comment: NEGATIVE
Diagnosis: NEGATIVE
High risk HPV: NEGATIVE

## 2022-08-16 DIAGNOSIS — N644 Mastodynia: Secondary | ICD-10-CM | POA: Diagnosis not present

## 2022-08-16 DIAGNOSIS — I10 Essential (primary) hypertension: Secondary | ICD-10-CM | POA: Diagnosis not present

## 2022-08-16 DIAGNOSIS — K047 Periapical abscess without sinus: Secondary | ICD-10-CM | POA: Diagnosis not present

## 2022-08-16 DIAGNOSIS — Z23 Encounter for immunization: Secondary | ICD-10-CM | POA: Diagnosis not present

## 2022-08-23 DIAGNOSIS — N644 Mastodynia: Secondary | ICD-10-CM | POA: Diagnosis not present

## 2022-09-11 ENCOUNTER — Encounter (HOSPITAL_COMMUNITY): Payer: Self-pay | Admitting: *Deleted

## 2022-09-11 ENCOUNTER — Ambulatory Visit (HOSPITAL_COMMUNITY)
Admission: EM | Admit: 2022-09-11 | Discharge: 2022-09-11 | Disposition: A | Discharge status: Home / Self Care | Payer: 59 | Attending: Nurse Practitioner | Admitting: Nurse Practitioner

## 2022-09-11 DIAGNOSIS — Z1152 Encounter for screening for COVID-19: Secondary | ICD-10-CM | POA: Insufficient documentation

## 2022-09-11 DIAGNOSIS — B9789 Other viral agents as the cause of diseases classified elsewhere: Secondary | ICD-10-CM | POA: Diagnosis not present

## 2022-09-11 DIAGNOSIS — J069 Acute upper respiratory infection, unspecified: Secondary | ICD-10-CM | POA: Insufficient documentation

## 2022-09-11 DIAGNOSIS — R059 Cough, unspecified: Secondary | ICD-10-CM | POA: Diagnosis not present

## 2022-09-11 MED ORDER — BENZONATATE 100 MG PO CAPS: 100.0000 mg | ORAL_CAPSULE | Freq: Three times a day (TID) | ORAL | 0 refills | Status: AC | PRN

## 2022-09-11 NOTE — Discharge Instructions (Addendum)

## 2022-09-11 NOTE — ED Triage Notes (Signed)
Adult nurse service used for clinical intake.    Pt states she has had sore throat and congestion x 3 days. She also has had mid upper back pain X 3 days.   She states she had dental implants in [place] on 07/24/2022 and had an infection which was treated with amox and flagyl. She did have one of the implants taken out on Wednesday.    She states her blood pressure has been high and she is taking her meds.

## 2022-09-11 NOTE — ED Provider Notes (Signed)
[facility]    CSN: [id number] Arrival date & time: 09/11/22  1618      History   Chief Complaint Chief Complaint  Patient presents with   Back Pain   Sore Throat   Nasal Congestion   Hypertension    HPI Cassandra Lozano is a 52 y.o. female.   Patient presents today with 3-day history of bodyaches and chills, dry cough, shortness of breath, runny and stuffy nose, sore throat, headache, left ear pain, nausea without vomiting, decreased appetite, and fatigue.  Also reports her blood pressure has been higher at home more than normal.  No measurable fevers at home, chest pain, abdominal pain, vomiting, or diarrhea.  No known sick contacts.  Has not take anything for symptoms so far.  Also reports she is feeling very unwell, not feeling like she has good blood flow to her head.  Denies syncope or passing out.  Recently had dental implant that got infected and was taking amoxicillin and Flagyl for it.  Reports a dentist told her this would treat her for a throat infection.  Reports he takes lisinopril 10 mg daily, normal blood pressures typically 120-125/60-70.  Last night, blood pressure was 163/87 and she became concerned.  She is wondering if she should take more blood pressure medication.     Past Medical History:  Diagnosis Date   Hypertension     There are no problems to display for this patient.   Past Surgical History:  Procedure Laterality Date   dental implant  07/24/2022   in [place]    OB History     Gravida  1   Para  1   Term  1   Preterm      AB      Living  2      SAB      IAB      Ectopic      Multiple  1   Live Births  2            Home Medications    Prior to Admission medications   Medication Sig Start Date End Date Taking? Authorizing Provider  benzonatate (TESSALON) 100 MG capsule Take 1 capsule (100 mg total) by mouth 3 (three) times daily as needed for cough. Do not take with alcohol or while driving or  operating heavy machinery.  May cause drowsiness. 09/11/22  Yes Cathlean Marseilles A, NP  lisinopril (ZESTRIL) 10 MG tablet Take 10 mg by mouth daily.   Yes [provider]  dicyclomine (BENTYL) 20 MG tablet Take 1 tablet (20 mg total) by mouth 3 (three) times daily before meals. Patient not taking: Reported on 11/08/2018 10/25/18   Gilda Crease, MD  docusate sodium (COLACE) 100 MG capsule Take 1 capsule (100 mg total) by mouth 2 (two) times daily. 11/08/18   Brock Bad, MD  famotidine (PEPCID) 20 MG tablet Take 1 tablet (20 mg total) by mouth 2 (two) times daily. Patient not taking: Reported on 11/08/2018 10/25/18   Gilda Crease, MD  fluconazole (DIFLUCAN) 150 MG tablet Take 150 mg by mouth once. Repeat dose in 8 days. 10/23/18   [provider]  ibuprofen (ADVIL) 800 MG tablet Take 1 tablet (800 mg total) by mouth every 8 (eight) hours as needed. 11/08/18   Brock Bad, MD  metroNIDAZOLE (FLAGYL) 500 MG tablet Take 500 mg by mouth 2 (two) times daily. 10/23/18   [provider]  ondansetron (ZOFRAN) 4 MG  tablet Take 1 tablet (4 mg total) by mouth every 6 (six) hours as needed for nausea or vomiting. Patient not taking: Reported on 11/08/2018 10/25/18   Gilda Crease, MD    Family History History reviewed. No pertinent family history.  Social History Social History   Tobacco Use   Smoking status: Never   Smokeless tobacco: Never  Vaping Use   Vaping status: Never Used  Substance Use Topics   Alcohol use: Never   Drug use: Never     Allergies   Patient has no known allergies.   Review of Systems Review of Systems Per HPI  Physical Exam Triage Vital Signs ED Triage Vitals  Encounter Vitals Group     BP 09/11/22 1652 (!) 150/89     Systolic BP Percentile --      Diastolic BP Percentile --      Pulse Rate 09/11/22 1652 93     Resp 09/11/22 1652 18     Temp 09/11/22 1652 98.9 F (37.2 C)     Temp Source  09/11/22 1652 Oral     SpO2 09/11/22 1652 98 %     Weight --      Height --      Head Circumference --      Peak Flow --      Pain Score 09/11/22 1648 8     Pain Loc --      Pain Education --      Exclude from Growth Chart --    No data found.  Updated Vital Signs BP (!) 150/89 (BP Location: Left Arm)   Pulse 93   Temp 98.9 F (37.2 C) (Oral)   Resp 18   LMP 10/07/2018 (Within Weeks)   SpO2 98%   Visual Acuity Right Eye Distance:   Left Eye Distance:   Bilateral Distance:    Right Eye Near:   Left Eye Near:    Bilateral Near:     Physical Exam Vitals and nursing note reviewed.  Constitutional:      General: She is not in acute distress.    Appearance: Normal appearance. She is not ill-appearing or toxic-appearing.  HENT:     Head: Normocephalic and atraumatic.     Right Ear: Ear canal and external ear normal. No drainage, swelling or tenderness. A middle ear effusion is present. Tympanic membrane is not erythematous.     Left Ear: Ear canal and external ear normal. No drainage, swelling or tenderness. A middle ear effusion is present. Tympanic membrane is not erythematous.     Nose: Rhinorrhea present. No congestion.     Mouth/Throat:     Mouth: Mucous membranes are moist.     Pharynx: Oropharynx is clear. Posterior oropharyngeal erythema present. No oropharyngeal exudate.     Tonsils: No tonsillar exudate or tonsillar abscesses. 0 on the right. 0 on the left.     Comments: Post nasal drainage Eyes:     General: No scleral icterus.    Extraocular Movements: Extraocular movements intact.  Cardiovascular:     Rate and Rhythm: Normal rate and regular rhythm.  Pulmonary:     Effort: Pulmonary effort is normal. No respiratory distress.     Breath sounds: Normal breath sounds. No wheezing, rhonchi or rales.  Abdominal:     General: Abdomen is flat. Bowel sounds are normal. There is no distension.     Palpations: Abdomen is soft.  Musculoskeletal:     Cervical back:  Normal range of motion and  neck supple.  Lymphadenopathy:     Cervical: No cervical adenopathy.  Skin:    General: Skin is warm and dry.     Coloration: Skin is not jaundiced or pale.     Findings: No erythema or rash.  Neurological:     Mental Status: She is alert and oriented to person, place, and time.     Motor: No weakness.  Psychiatric:        Mood and Affect: Mood is anxious.        Behavior: Behavior is cooperative.      UC Treatments / Results  Labs (all labs ordered are listed, but only abnormal results are displayed) Labs Reviewed  SARS CORONAVIRUS 2 (TAT 6-24 HRS)    EKG   Radiology No results found.  Procedures Procedures (including critical care time)  Medications Ordered in UC Medications - No data to display  Initial Impression / Assessment and Plan / UC Course  I have reviewed the triage vital signs and the nursing notes.  Pertinent labs & imaging results that were available during my care of the patient were reviewed by me and considered in my medical decision making (see chart for details).   Patient is well-appearing, afebrile, not tachycardic, not tachypneic, oxygenating well on room air.  Patient is mildly hypertensive in urgent care today.  1. Viral URI with cough 2. Encounter for screening for COVID-19 Suspect viral etiology Reassurance provided to patient Vitals and exam today are reassuring COVID-19 testing obtained Supportive care discussed with patient ER and return precautions also discussed  The patient was given the opportunity to ask questions.  All questions answered to their satisfaction.  The patient is in agreement to this plan.    Final Clinical Impressions(s) / UC Diagnoses   Final diagnoses:  Viral URI with cough  Encounter for screening for COVID-19     Discharge Instructions      You have a viral upper respiratory infection.  Symptoms should improve over the next week to 10 days.  If you develop chest pain or  shortness of breath, go to the emergency room.  We have tested you today for COVID-19.  You will see the results in Mychart and we will call you with positive results.  Please stay home and isolate until you are aware of the results.    Some things that can make you feel better are: - Increased rest - Increasing fluid with water/sugar free electrolytes - Acetaminophen and ibuprofen as needed for fever/pain - Salt water gargling, chloraseptic spray and throat lozenges for sore throat - OTC guaifenesin (Mucinex) 600 mg twice daily for congestion - Saline sinus flushes or a neti pot - Humidifying the air -Tessalon Perles every 8 hours as needed for dry cough     ED Prescriptions     Medication Sig Dispense Auth. Provider   benzonatate (TESSALON) 100 MG capsule Take 1 capsule (100 mg total) by mouth 3 (three) times daily as needed for cough. Do not take with alcohol or while driving or operating heavy machinery.  May cause drowsiness. 21 capsule Valentino Nose, NP      PDMP not reviewed this encounter.   Valentino Nose, NP 09/11/22 1740

## 2022-09-12 LAB — SARS CORONAVIRUS 2 (TAT 6-24 HRS): SARS Coronavirus 2: NEGATIVE

## 2022-09-20 DIAGNOSIS — J069 Acute upper respiratory infection, unspecified: Secondary | ICD-10-CM | POA: Diagnosis not present

## 2022-09-20 DIAGNOSIS — J029 Acute pharyngitis, unspecified: Secondary | ICD-10-CM | POA: Diagnosis not present

## 2022-09-20 DIAGNOSIS — I10 Essential (primary) hypertension: Secondary | ICD-10-CM | POA: Diagnosis not present

## 2022-11-29 DIAGNOSIS — I10 Essential (primary) hypertension: Secondary | ICD-10-CM | POA: Diagnosis not present

## 2023-01-17 DIAGNOSIS — Z1231 Encounter for screening mammogram for malignant neoplasm of breast: Secondary | ICD-10-CM | POA: Diagnosis not present

## 2023-03-06 DIAGNOSIS — R350 Frequency of micturition: Secondary | ICD-10-CM | POA: Diagnosis not present

## 2023-03-06 DIAGNOSIS — R8290 Unspecified abnormal findings in urine: Secondary | ICD-10-CM | POA: Diagnosis not present

## 2023-03-06 DIAGNOSIS — R319 Hematuria, unspecified: Secondary | ICD-10-CM | POA: Diagnosis not present

## 2023-05-30 DIAGNOSIS — I10 Essential (primary) hypertension: Secondary | ICD-10-CM | POA: Diagnosis not present

## 2023-08-08 DIAGNOSIS — Z01419 Encounter for gynecological examination (general) (routine) without abnormal findings: Secondary | ICD-10-CM | POA: Diagnosis not present

## 2023-08-08 DIAGNOSIS — Z124 Encounter for screening for malignant neoplasm of cervix: Secondary | ICD-10-CM | POA: Diagnosis not present

## 2023-10-22 IMAGING — US US PELVIS COMPLETE WITH TRANSVAGINAL
1 series · 14 of 25 positions shown · non-contrast
Comparison: None

CLINICAL DATA: Left-sided pelvic pain x3 months.

EXAM:
TRANSABDOMINAL AND TRANSVAGINAL ULTRASOUND OF PELVIS
TECHNIQUE: Both transabdominal and transvaginal ultrasound examinations of the
pelvis were performed. Transabdominal technique was performed for
global imaging of the pelvis including uterus, ovaries, adnexal
regions, and pelvic cul-de-sac. It was necessary to proceed with
endovaginal exam following the transabdominal exam to visualize the
uterus, endometrium, bilateral ovaries and bilateral adnexa.

[Series 1: us pelvic complete with transvaginal · 14 of 74 slices shown]
[im 1/74]
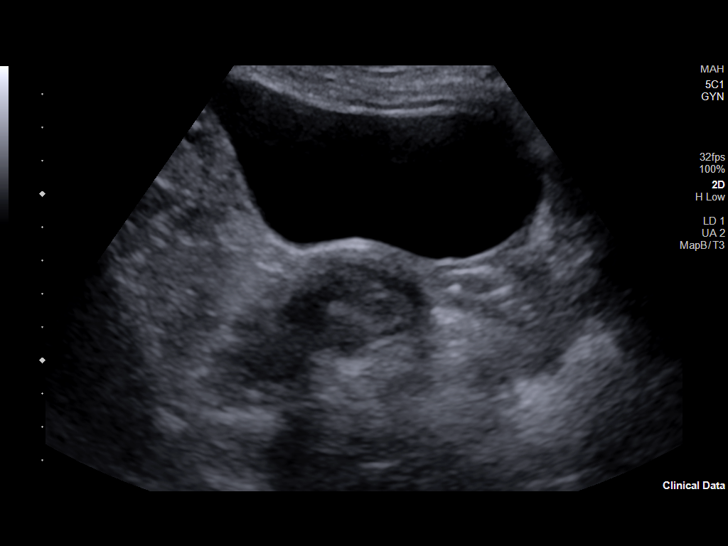
[im 7/74]
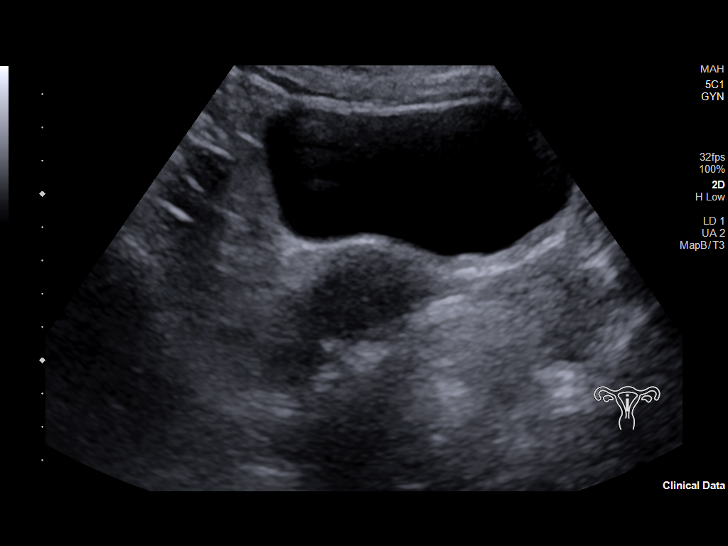
[im 13/74]
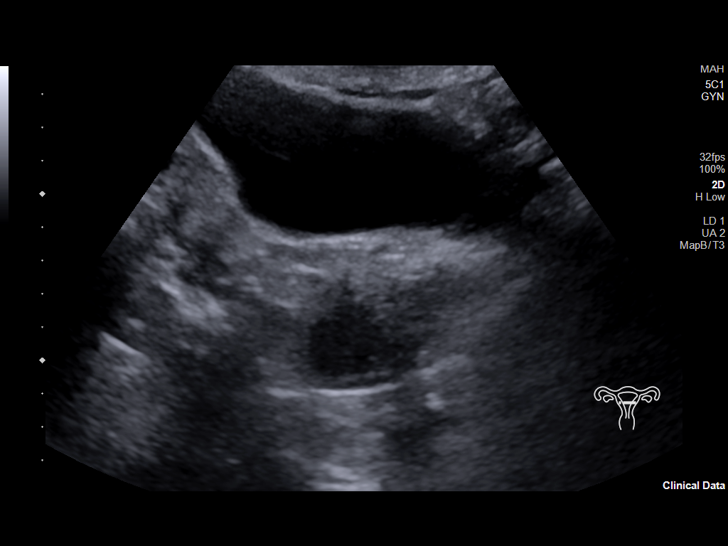
[im 19/74]
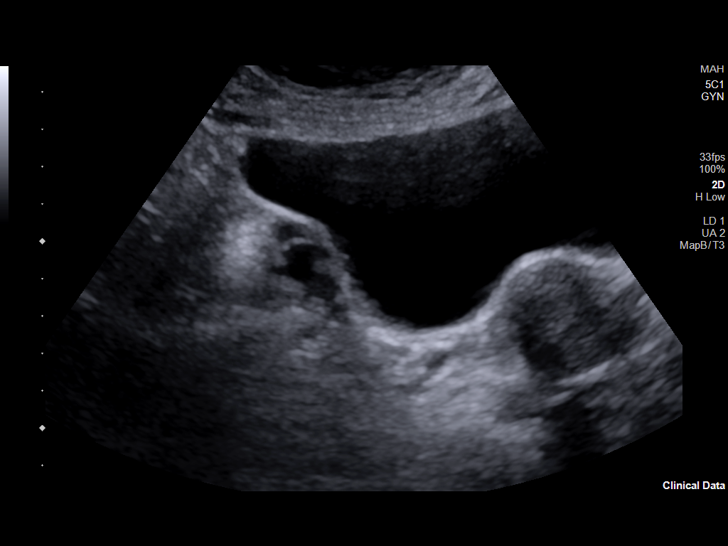
[im 25/74]
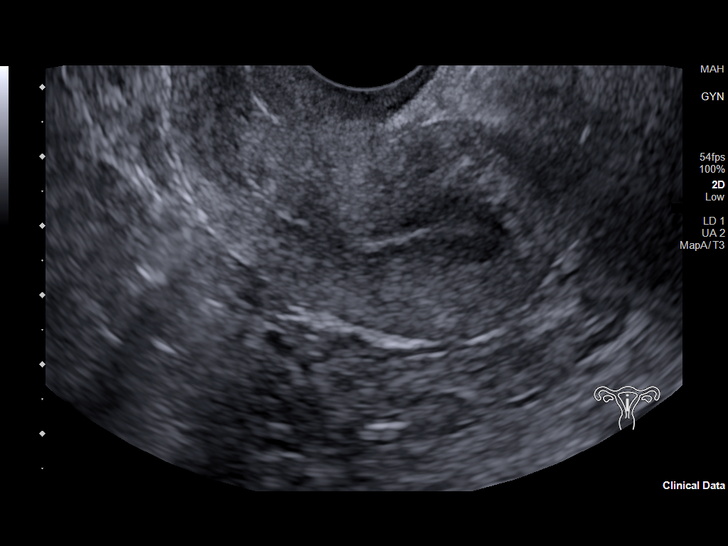
[im 28/74]
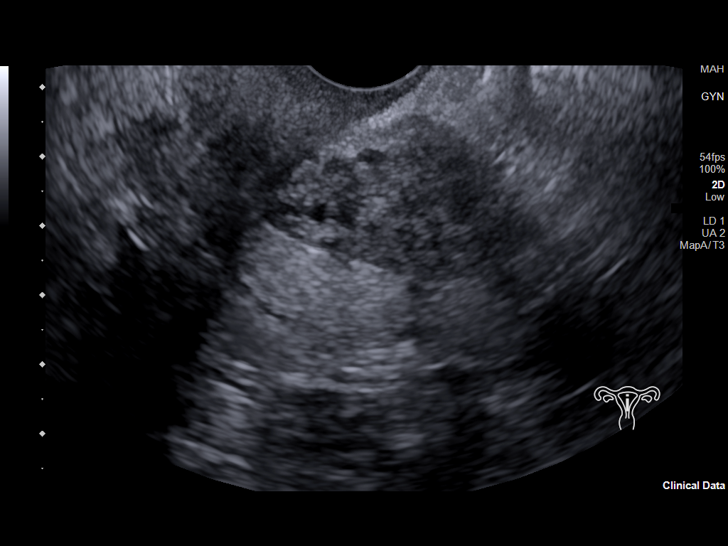
[im 34/74]
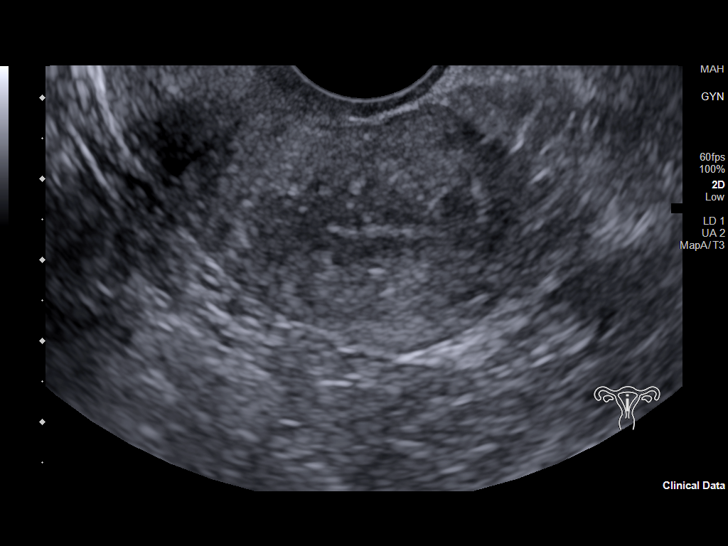
[im 40/74]
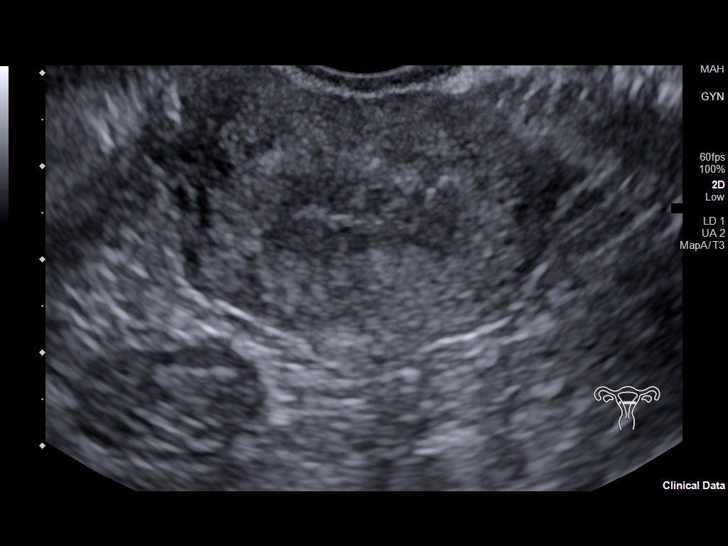
[im 46/74]
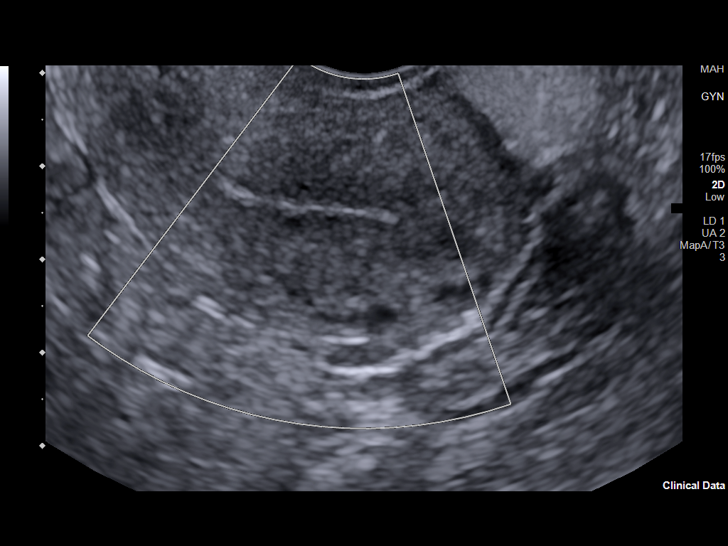
[im 49/74]
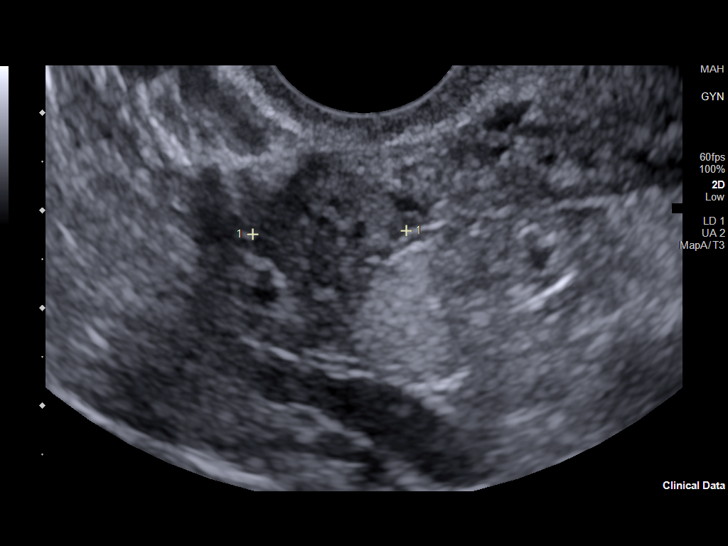
[im 55/74]
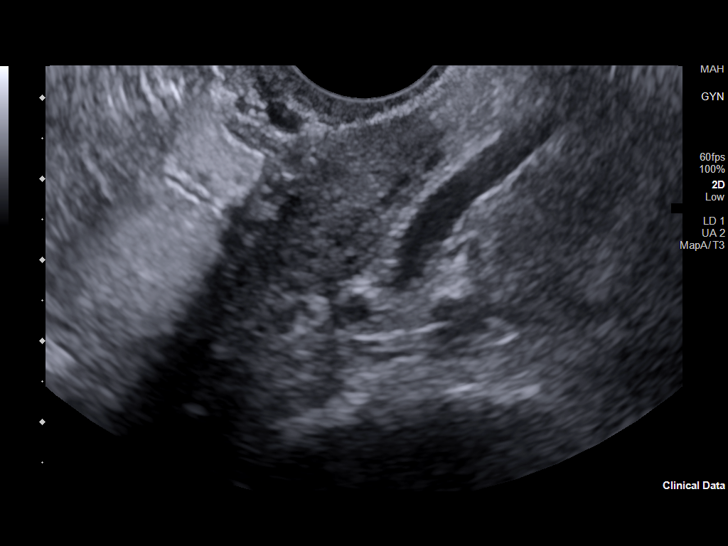
[im 61/74]
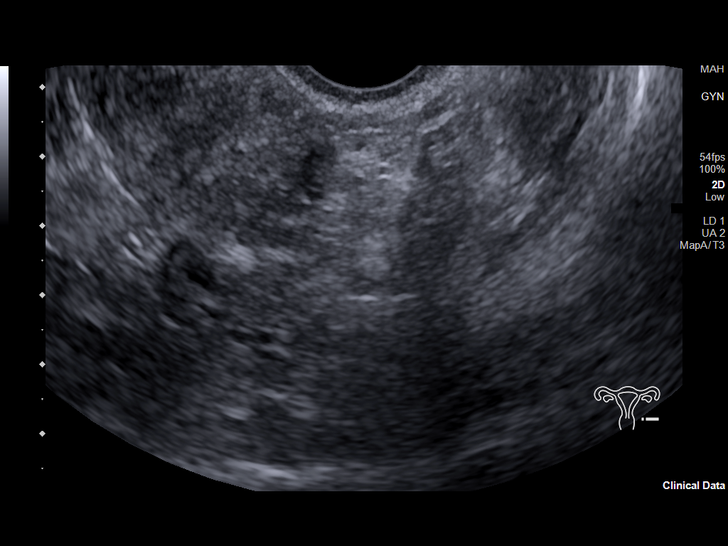
[im 67/74]
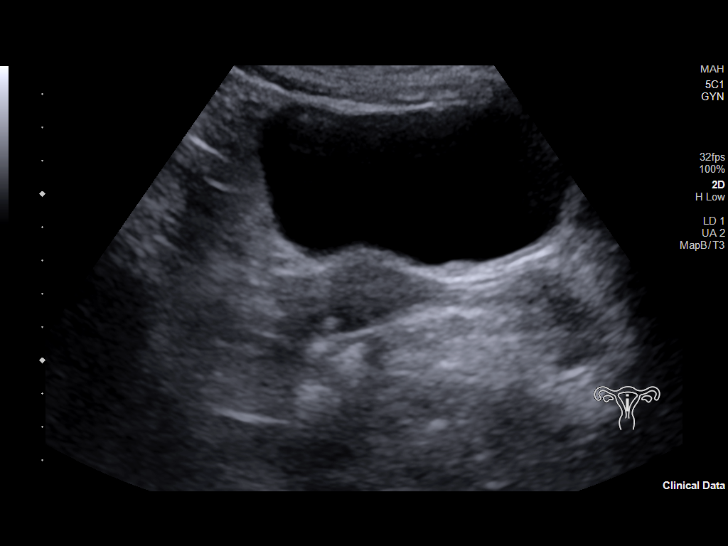
[im 74/74]
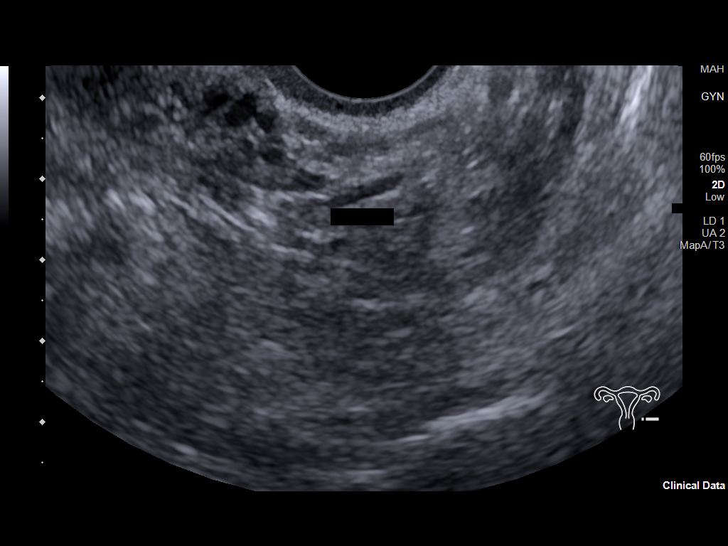

[14 of 25 positions shown; findings below may reference images not displayed]

FINDINGS: Uterus

Measurements: 7.4 cm x 3.2 cm x 3.5 cm = volume: 43.3 mL. A 1.0 cm x
0.8 cm x 0.9 cm heterogeneous uterine fibroid is seen along the
midline of the uterine fundus, adjacent to the endometrial canal.

Endometrium

Thickness: 1.5 mm.  No focal abnormality visualized.

Right ovary

Measurements: 2.9 cm x 1.3 cm x 1.6 cm = volume: 3.0 mL. Normal
appearance/no adnexal mass.

Left ovary

The left ovary is not visualized secondary to overlying bowel gas.

Other findings

No abnormal free fluid.
IMPRESSION: Findings likely consistent with a small, heterogeneous uterine
fibroid.

## 2023-12-05 DIAGNOSIS — I10 Essential (primary) hypertension: Secondary | ICD-10-CM | POA: Diagnosis not present
